# Patient Record
Sex: Female | Born: 1940 | Race: White | Hispanic: No | Marital: Married | Smoking: Never smoker
Health system: Southern US, Community
[De-identification: ages and names within clinical notes are randomized; demographics above are authoritative.]

## PROBLEM LIST (undated history)

## (undated) DIAGNOSIS — Z853 Personal history of malignant neoplasm of breast: Secondary | ICD-10-CM

## (undated) DIAGNOSIS — I10 Essential (primary) hypertension: Secondary | ICD-10-CM

## (undated) DIAGNOSIS — H9190 Unspecified hearing loss, unspecified ear: Secondary | ICD-10-CM

## (undated) DIAGNOSIS — F32A Depression, unspecified: Secondary | ICD-10-CM

## (undated) DIAGNOSIS — C44722 Squamous cell carcinoma of skin of right lower limb, including hip: Secondary | ICD-10-CM

## (undated) HISTORY — PX: BREAST LUMPECTOMY: SHX2

## (undated) HISTORY — PX: BREAST BIOPSY: SHX20

## (undated) HISTORY — PX: COLONOSCOPY: SHX174

## (undated) HISTORY — PX: CATARACT EXTRACTION: SUR2

## (undated) HISTORY — PX: COCHLEAR IMPLANT: SUR684

---

## 1999-11-22 DIAGNOSIS — Z510 Encounter for antineoplastic radiation therapy: Secondary | ICD-10-CM | POA: Insufficient documentation

## 2011-04-17 ENCOUNTER — Ambulatory Visit: Payer: Self-pay | Admitting: Internal Medicine

## 2011-05-23 DIAGNOSIS — Z800 Family history of malignant neoplasm of digestive organs: Secondary | ICD-10-CM | POA: Insufficient documentation

## 2011-05-23 DIAGNOSIS — D126 Benign neoplasm of colon, unspecified: Secondary | ICD-10-CM | POA: Insufficient documentation

## 2011-05-30 DIAGNOSIS — L570 Actinic keratosis: Secondary | ICD-10-CM | POA: Insufficient documentation

## 2012-02-07 ENCOUNTER — Ambulatory Visit: Payer: Self-pay | Admitting: Internal Medicine

## 2012-06-28 ENCOUNTER — Ambulatory Visit: Payer: Self-pay

## 2015-01-02 DIAGNOSIS — F32A Depression, unspecified: Secondary | ICD-10-CM | POA: Insufficient documentation

## 2015-01-24 ENCOUNTER — Ambulatory Visit
Admission: EM | Admit: 2015-01-24 | Discharge: 2015-01-24 | Disposition: A | Discharge status: Home / Self Care | Payer: Medicare Other | Attending: Internal Medicine | Admitting: Internal Medicine

## 2015-01-24 ENCOUNTER — Ambulatory Visit: Payer: Medicare Other

## 2015-01-24 DIAGNOSIS — S63501A Unspecified sprain of right wrist, initial encounter: Secondary | ICD-10-CM | POA: Diagnosis not present

## 2015-01-24 DIAGNOSIS — X58XXXA Exposure to other specified factors, initial encounter: Secondary | ICD-10-CM | POA: Diagnosis not present

## 2015-01-24 DIAGNOSIS — M25531 Pain in right wrist: Secondary | ICD-10-CM | POA: Diagnosis present

## 2015-01-24 NOTE — Discharge Instructions (Signed)
Ligament Sprain °Ligaments are tough, fibrous tissues that hold bones together at the joints. A sprain can occur when a ligament is stretched. This injury may take several weeks to heal. °HOME CARE INSTRUCTIONS  °· Rest the injured area for as long as directed by your caregiver. Then slowly start using the joint as directed by your caregiver and as the pain allows. °· Keep the affected joint raised if possible to lessen swelling. °· Apply ice for 15-20 minutes to the injured area every couple hours for the first half day, then 03-04 times per day for the first 48 hours. Put the ice in a plastic bag and place a towel between the bag of ice and your skin. °· Wear any splinting, casting, or elastic bandage applications as instructed. °· Only take over-the-counter or prescription medicines for pain, discomfort, or fever as directed by your caregiver. Do not use aspirin immediately after the injury unless instructed by your caregiver. Aspirin can cause increased bleeding and bruising of the tissues. °· If you were given crutches, continue to use them as instructed and do not resume weight bearing on the affected extremity until instructed. °SEEK MEDICAL CARE IF:  °· Your bruising, swelling, or pain increases. °· You have cold and numb fingers or toes if your arm or leg was injured. °SEEK IMMEDIATE MEDICAL CARE IF:  °· Your toes are numb or blue if your leg was injured. °· Your fingers are numb or blue if your arm was injured. °· Your pain is not responding to medicines and continues to stay the same or gets worse. °MAKE SURE YOU:  °· Understand these instructions. °· Will watch your condition. °· Will get help right away if you are not doing well or get worse. °Document Released: 08/01/2000 Document Revised: 10/27/2011 Document Reviewed: 05/30/2008 °ExitCare® Patient Information ©2015 [employer]. This information is not intended to replace advice given to you by your health care provider. Make sure you discuss any  questions you have with your health care provider. ° °

## 2015-01-24 NOTE — ED Provider Notes (Signed)
CSN: [id number]     Arrival date & time 01/24/15  1623 History   First MD Initiated Contact with Patient 01/24/15 1736     Chief Complaint  Patient presents with  . Wrist Pain   (Consider location/radiation/quality/duration/timing/severity/associated sxs/prior Treatment) HPI  This 74 year old female who presents with acute right wrist pain which indicates the volar surface of her distal forearm. She was weeding today having to pull hard on a rake when she felt a pop in her wrist and pain since then the pain is painful to flex her wrist and has some decreased range of motion but no swelling ecchymosis or erythema. denies any numbness or tingling.    History reviewed. No pertinent past medical history. History reviewed. No pertinent past surgical history. Family History  Problem Relation Age of Onset  . Alzheimer's disease Mother   . Heart attack Father    History  Substance Use Topics  . Smoking status: Never Smoker   . Smokeless tobacco: Not on file  . Alcohol Use: Yes     Comment: socially   OB History    No data available     Review of Systems  Musculoskeletal: Positive for myalgias.  All other systems reviewed and are negative.   Allergies  Review of patient's allergies indicates no known allergies.  Home Medications   Prior to Admission medications   Not on File   BP 169/90 mmHg  Pulse 82  Temp(Src) 97.9 F (36.6 C) (Oral)  Resp 18  Ht  (1.651 m)  Wt 120 lb (54.432 kg)  BMI 19.97 kg/m2  SpO2 98% Physical Exam  Constitutional: She appears well-developed and well-nourished.  HENT:  Head: Normocephalic and atraumatic.  Eyes: EOM are normal. Pupils are equal, round, and reactive to light.  Musculoskeletal:  examination of the right wrist shows no swelling erythema or ecchymosis.range of motion to flexion and extension is slightly limited by pain that she indicates over the volar surface of her distal forearm. Is no significant pain to deep palpation  however. She has a small ecchymosis over the first CM but no pain. Negative Finkelstein's test. she has good finger range of motion. Is good strength.    ED Course  Procedures (including critical care time) Labs Review Labs Reviewed - No data to display  Imaging Review Dg Wrist Complete Right  01/24/2015   CLINICAL DATA:  74 year old female with recent right-sided wrist pain while gardening complaining of a pop and a sharp pain on the radial side of the right wrist.  EXAM: RIGHT WRIST - COMPLETE 3+ VIEW  COMPARISON:  No priors.  FINDINGS: Old healed ulnar styloid avulsion fracture. No acute displaced fracture, subluxation or dislocation. Mild multifocal joint space narrowing, subchondral sclerosis and osteophyte formation throughout the wrist, indicative of mild osteoarthritis.  IMPRESSION: 1. No acute abnormality of the right wrist. 2. Mild osteoarthritis throughout the wrist. 3. Old healed ulnar styloid avulsion fracture incidentally noted.   Electronically Signed   By: Trudie Reed M.D.   On: 01/24/2015 17:56     MDM   1. Wrist sprain, right, initial encounter    Spoke with the patient and her husband regarding x-ray findings and my exam. I recommended that she discontinue gardening for about 1 week. To use her wrist splint for comfort. When she returns to gardening have recommended that she continue to use a wrist splint for those active times. She decided to take ibuprofen at home and I have recommended 600 mg 3 times  a day with food. He continues to have problems she will follow-up with her primary care  Plan: 1. Test/x-ray results and diagnosis reviewed with patient 2. rx as per orders; risks, benefits, potential side effects reviewed with patient 3. Recommend supportive treatment with  4. F/u prn if symptoms worsen or don't improve   Lutricia FeilWilliam P Tabius Rood, PA-C 01/24/15 1810

## 2015-01-24 NOTE — ED Notes (Signed)
States using a garden tool and right wrist began to hurt. + slight swelling.

## 2015-09-18 DIAGNOSIS — Z9621 Cochlear implant status: Secondary | ICD-10-CM | POA: Insufficient documentation

## 2015-09-18 DIAGNOSIS — H905 Unspecified sensorineural hearing loss: Secondary | ICD-10-CM | POA: Insufficient documentation

## 2015-09-18 DIAGNOSIS — C50919 Malignant neoplasm of unspecified site of unspecified female breast: Secondary | ICD-10-CM | POA: Insufficient documentation

## 2016-01-11 DIAGNOSIS — J309 Allergic rhinitis, unspecified: Secondary | ICD-10-CM | POA: Insufficient documentation

## 2016-01-11 DIAGNOSIS — B9689 Other specified bacterial agents as the cause of diseases classified elsewhere: Secondary | ICD-10-CM | POA: Insufficient documentation

## 2016-02-13 ENCOUNTER — Ambulatory Visit (INDEPENDENT_AMBULATORY_CARE_PROVIDER_SITE_OTHER): Payer: Medicare Other

## 2016-02-13 ENCOUNTER — Ambulatory Visit
Admission: EM | Admit: 2016-02-13 | Discharge: 2016-02-13 | Disposition: A | Discharge status: Home / Self Care | Payer: Medicare Other | Attending: Family Medicine | Admitting: Family Medicine

## 2016-02-13 DIAGNOSIS — S61012A Laceration without foreign body of left thumb without damage to nail, initial encounter: Secondary | ICD-10-CM | POA: Diagnosis not present

## 2016-02-13 HISTORY — DX: Personal history of malignant neoplasm of breast: Z85.3

## 2016-02-13 MED ORDER — MUPIROCIN 2 % EX OINT: TOPICAL_OINTMENT | CUTANEOUS | Status: DC

## 2016-02-13 MED ORDER — TETANUS-DIPHTH-ACELL PERTUSSIS 5-2.5-18.5 LF-MCG/0.5 IM SUSP
0.5000 mL | Freq: Once | INTRAMUSCULAR | Status: AC
  Administered 2016-02-13: 0.5 mL via INTRAMUSCULAR

## 2016-02-13 MED ORDER — LIDOCAINE HCL (PF) 1 % IJ SOLN: 5.0000 mL | Freq: Once | INTRAMUSCULAR | Status: DC

## 2016-02-13 MED ORDER — BUPIVACAINE HCL (PF) 0.25 % IJ SOLN: 5.0000 mL | Freq: Once | INTRAMUSCULAR | Status: DC

## 2016-02-13 MED ORDER — CEPHALEXIN 500 MG PO CAPS: 500.0000 mg | ORAL_CAPSULE | Freq: Three times a day (TID) | ORAL | Status: DC

## 2016-02-13 NOTE — ED Notes (Signed)
Patient complains of laceration to her left hand thumb. Patient states that incident happened around 1pm today while fixing lunch. Patient states that she cut her hand on the can opener.

## 2016-02-13 NOTE — ED Provider Notes (Signed)
[facility]  ____________________________________________  Time seen: Approximately 2:55 PM  I have reviewed the triage vital signs and the nursing notes.   HISTORY  Chief Complaint Extremity Laceration  HPI Connie Ellison is a 75 y.o. female presents for laceration to left thumb. Reports this happened just prior to arrival when she was opening a can of corn. Denies fall or other trauma, denies crush injury. Denies other injury. States mild pain at laceration site. Denies numbness or tingling sensation. Denies decreased range of motion.     Past Medical History  Diagnosis Date  . History of breast cancer     There are no active problems to display for this patient.   Past Surgical History  Procedure Laterality Date  . Cochlear implant      Current Outpatient Rx  Name  Route  Sig  Dispense  Refill  . venlafaxine (EFFEXOR) 75 MG tablet   Oral   Take 75 mg by mouth 2 (two) times daily.           Allergies Review of patient's allergies indicates no known allergies.  Family History  Problem Relation Age of Onset  . Alzheimer's disease Mother   . Heart attack Father     Social History Social History  Substance Use Topics  . Smoking status: Never Smoker   . Smokeless tobacco: None  . Alcohol Use: 0.0 oz/week    0 Standard drinks or equivalent per week     Comment: socially    Review of Systems Constitutional: No fever/chills Eyes: No visual changes. ENT: No sore throat. Cardiovascular: Denies chest pain. Respiratory: Denies shortness of breath. Gastrointestinal: No abdominal pain.  No nausea, no vomiting.  No diarrhea.  No constipation. Genitourinary: Negative for dysuria. Musculoskeletal: Negative for back pain. Skin: Negative for rash. As above.  Neurological: Negative for headaches, focal weakness or numbness.  10-point ROS otherwise negative.  ____________________________________________   PHYSICAL EXAM:  VITAL SIGNS: ED Triage Vitals   Enc Vitals Group     BP 02/13/16 1415 162/98 mmHg     Pulse Rate 02/13/16 1415 87     Resp 02/13/16 1415 16     Temp 02/13/16 1415 98.1 F (36.7 C)     Temp Source 02/13/16 1415 Tympanic     SpO2 02/13/16 1415 99 %     Weight 02/13/16 1415 120 lb (54.432 kg)     Height 02/13/16 1415 5\' 5"  (1.651 m)     Head Cir --      Peak Flow --      Pain Score 02/13/16 1418 5     Pain Loc --      Pain Edu? --      Excl. in GC? --    Constitutional: Alert and oriented. Well appearing and in no acute distress. Eyes: Conjunctivae are normal. PERRL. EOMI. Head: Atraumatic.  Ears: normal external appearance.   Nose: No congestion/rhinnorhea.  Mouth/Throat: Mucous membranes are moist.   Neck: No stridor.  No cervical spine tenderness to palpation. Hematological/Lymphatic/Immunilogical: No cervical lymphadenopathy. Cardiovascular: Normal rate, regular rhythm. Grossly normal heart sounds.  Good peripheral circulation. Respiratory: Normal respiratory effort.  No retractions. Lungs CTAB. No wheezes, rales or rhonchi.  Gastrointestinal: Soft and nontender. No distention.  Musculoskeletal: No lower or upper extremity tenderness nor edema.  Neurologic:  Normal speech and language. No gross focal neurologic deficits are appreciated. No gait instability. Skin:  Skin is warm, dry and intact. No rash noted. Except: left medial distal thumb  with 5 cm flap laceration, mild active bleeding, no foreign body visualized, no tendon or bone visualized, full range of motion, sensation intact to left thumb, no motor or tendon deficit, minimal tenderness, left arm otherwise nontender.  Psychiatric: Mood and affect are normal. Speech and behavior are normal.  ____________________________________________   LABS (all labs ordered are listed, but only abnormal results are displayed)  Labs Reviewed - No data to display ____________________________________________  RADIOLOGY  EXAM: LEFT THUMB 2+V  COMPARISON:  02/07/2012  FINDINGS: Soft tissue laceration noted near the tip of the left thumb. No radiopaque foreign body. No fracture, subluxation or dislocation.  IMPRESSION: No acute bony abnormality or radiopaque foreign body.   Electronically Signed By: Charlett NoseKevin Dover M.D. On: 02/13/2016 15:17  I, Renford DillsLindsey Karema Tocci, personally viewed and evaluated these images (plain radiographs) as part of my medical decision making, as well as reviewing the written report by the radiologist.  ____________________________________________   PROCEDURES  Procedure(s) performed:  Procedure(s) performed:  Procedure explained and verbal consent obtained. Consent: Verbal consent obtained. Written consent not obtained. Risks and benefits: risks, benefits and alternatives were discussed Patient identity confirmed: verbally with patient and hospital-assigned identification number  Consent given by: patient   Laceration Repair Location: left thumb Length: 5 cm Foreign bodies: no foreign bodies Tendon involvement: none Nerve involvement: none Preparation: Patient was prepped and draped in the usual sterile fashion. Anesthesia with left thumb digital block with 0.25% bupivacaine.  Irrigation solution: saline and betadine Irrigation method: jet lavage  Amount of cleaning: copious Repaired with 5-0 nylon Number of sutures: 10 Technique: simple interrupted  Approximation: loose Patient tolerate well. Wound well approximated post repair.  Antibiotic ointment and dressing applied.  Wound care instructions provided.  Observe for any signs of infection or other problems.     ____________________________________________   INITIAL IMPRESSION / ASSESSMENT AND PLAN / ED COURSE  Pertinent labs & imaging results that were available during my care of the patient were reviewed by me and considered in my medical decision making (see chart for details).  Well appearing patient, presented for laceration to left  thumb post accidental cutting self with can opener at home.Left thumb xray negative for acute bony abnormality or radiopaque foreign body per radiologist. Laceration repaired with x 10 sutures. Patient tolerated well. Discussed wound care at home. Tetanus immunization updated. Dressing applied. Wound check in 2-3 days. Suture removal in 7-10 days. Will start on oral cephalexin and topical bactroban. Discussed indication, risks and benefits of medications with patient.  Discussed follow up with Primary care physician this week. Discussed follow up and return parameters including no resolution or any worsening concerns. Patient verbalized understanding and agreed to plan.   ____________________________________________   FINAL CLINICAL IMPRESSION(S) / ED DIAGNOSES  Final diagnoses:  Thumb laceration, left, initial encounter     Discharge Medication List as of 02/13/2016  3:41 PM    START taking these medications   Details  cephALEXin (KEFLEX) 500 MG capsule Take 1 capsule (500 mg total) by mouth 3 (three) times daily., Starting 02/13/2016, Until Discontinued, Normal    mupirocin ointment (BACTROBAN) 2 % Apply three times a day for 5 days., Normal        Note: This dictation was prepared with Dragon dictation along with smaller phrase technology. Any transcriptional errors that result from this process are unintentional.       Renford DillsLindsey Thania Woodlief, NP 02/19/16 [phone]

## 2016-02-13 NOTE — Discharge Instructions (Signed)
Take medication as prescribed. Keep clean and dry as discussed.   Return to Urgent care in 7-10 days for suture removal.  Follow up with your primary care physician this week as needed. Return to Urgent care sooner for redness, swelling, drainage, new or worsening concerns.    Laceration Care, Adult A laceration is a cut that goes through all layers of the skin. The cut also goes into the tissue that is right under the skin. Some cuts heal on their own. Others need to be closed with stitches (sutures), staples, skin adhesive strips, or wound glue. Taking care of your cut lowers your risk of infection and helps your cut to heal better. HOW TO TAKE CARE OF YOUR CUT For stitches or staples:  Keep the wound clean and dry.  If you were given a bandage (dressing), you should change it at least one time per day or as told by your doctor. You should also change it if it gets wet or dirty.  Keep the wound completely dry for the first 24 hours or as told by your doctor. After that time, you may take a shower or a bath. However, make sure that the wound is not soaked in water until after the stitches or staples have been removed.  Clean the wound one time each day or as told by your doctor:  Wash the wound with soap and water.  Rinse the wound with water until all of the soap comes off.  Pat the wound dry with a clean towel. Do not rub the wound.  After you clean the wound, put a thin layer of antibiotic ointment on it as told by your doctor. This ointment:  Helps to prevent infection.  Keeps the bandage from sticking to the wound.  Have your stitches or staples removed as told by your doctor. If your doctor used skin adhesive strips:   Keep the wound clean and dry.  If you were given a bandage, you should change it at least one time per day or as told by your doctor. You should also change it if it gets dirty or wet.  Do not get the skin adhesive strips wet. You can take a shower or a  bath, but be careful to keep the wound dry.  If the wound gets wet, pat it dry with a clean towel. Do not rub the wound.  Skin adhesive strips fall off on their own. You can trim the strips as the wound heals. Do not remove any strips that are still stuck to the wound. They will fall off after a while. If your doctor used wound glue:  Try to keep your wound dry, but you may briefly wet it in the shower or bath. Do not soak the wound in water, such as by swimming.  After you take a shower or a bath, gently pat the wound dry with a clean towel. Do not rub the wound.  Do not do any activities that will make you really sweaty until the skin glue has fallen off on its own.  Do not apply liquid, cream, or ointment medicine to your wound while the skin glue is still on.  If you were given a bandage, you should change it at least one time per day or as told by your doctor. You should also change it if it gets dirty or wet.  If a bandage is placed over the wound, do not let the tape for the bandage touch the skin glue.  Do not pick at the glue. The skin glue usually stays on for 5-10 days. Then, it falls off of the skin. General Instructions  To help prevent scarring, make sure to cover your wound with sunscreen whenever you are outside after stitches are removed, after adhesive strips are removed, or when wound glue stays in place and the wound is healed. Make sure to wear a sunscreen of at least 30 SPF.  Take over-the-counter and prescription medicines only as told by your doctor.  If you were given antibiotic medicine or ointment, take or apply it as told by your doctor. Do not stop using the antibiotic even if your wound is getting better.  Do not scratch or pick at the wound.  Keep all follow-up visits as told by your doctor. This is important.  Check your wound every day for signs of infection. Watch for:  Redness, swelling, or pain.  Fluid, blood, or pus.  Raise (elevate) the  injured area above the level of your heart while you are sitting or lying down, if possible. GET HELP IF:  You got a tetanus shot and you have any of these problems at the injection site:  Swelling.  Very bad pain.  Redness.  Bleeding.  You have a fever.  A wound that was closed breaks open.  You notice a bad smell coming from your wound or your bandage.  You notice something coming out of the wound, such as wood or glass.  Medicine does not help your pain.  You have more redness, swelling, or pain at the site of your wound.  You have fluid, blood, or pus coming from your wound.  You notice a change in the color of your skin near your wound.  You need to change the bandage often because fluid, blood, or pus is coming from the wound.  You start to have a new rash.  You start to have numbness around the wound. GET HELP RIGHT AWAY IF:  You have very bad swelling around the wound.  Your pain suddenly gets worse and is very bad.  You notice painful lumps near the wound or on skin that is anywhere on your body.  You have a red streak going away from your wound.  The wound is on your hand or foot and you cannot move a finger or toe like you usually can.  The wound is on your hand or foot and you notice that your fingers or toes look pale or bluish.   This information is not intended to replace advice given to you by your health care provider. Make sure you discuss any questions you have with your health care provider.   Document Released: 01/21/2008 Document Revised: 12/19/2014 Document Reviewed: 07/31/2014 Elsevier Interactive Patient Education [employer]2016 [employer]

## 2016-02-26 ENCOUNTER — Encounter: Payer: Self-pay | Admitting: *Deleted

## 2016-02-26 ENCOUNTER — Ambulatory Visit
Admission: EM | Admit: 2016-02-26 | Discharge: 2016-02-26 | Disposition: A | Discharge status: Home / Self Care | Payer: Medicare Other

## 2016-02-26 DIAGNOSIS — Z4802 Encounter for removal of sutures: Secondary | ICD-10-CM | POA: Diagnosis not present

## 2016-02-26 NOTE — ED Notes (Signed)
Patient is here for suture removal. Laceration is clean with no redness or drainage present. 11 sutures removed.

## 2016-05-05 DIAGNOSIS — I10 Essential (primary) hypertension: Secondary | ICD-10-CM | POA: Insufficient documentation

## 2016-06-17 DIAGNOSIS — Z6791 Unspecified blood type, Rh negative: Secondary | ICD-10-CM | POA: Insufficient documentation

## 2016-07-04 ENCOUNTER — Ambulatory Visit
Admission: EM | Admit: 2016-07-04 | Discharge: 2016-07-04 | Disposition: A | Discharge status: Home / Self Care | Payer: Medicare Other | Attending: Family Medicine | Admitting: Family Medicine

## 2016-07-04 DIAGNOSIS — S61213A Laceration without foreign body of left middle finger without damage to nail, initial encounter: Secondary | ICD-10-CM | POA: Diagnosis not present

## 2016-07-04 NOTE — ED Triage Notes (Signed)
Patient has a laceration to her left middle finger. Injury occurred last night around 5pm. Patient states that she cut her finger while trying to chop carrots.

## 2016-07-04 NOTE — ED Provider Notes (Signed)
CSN: [id number]     Arrival date & time 07/04/16  [id number] History   None    Chief Complaint  Patient presents with  . Laceration   (Consider location/radiation/quality/duration/timing/severity/associated sxs/prior Treatment) HPI  This 75 year old female who presents with a laceration to her nondominant left middle finger. His occurred last night at 5 PM while she was preparing vegetables. Do not come to the clinic at that time because she does not like driving at night. States that it bled all night long. In the clinic this morning she had any bleeding. Is current on her tetanus toxoid having cut herself about a month and a half ago on a can lid.       Past Medical History:  Diagnosis Date  . History of breast cancer    Past Surgical History:  Procedure Laterality Date  . COCHLEAR IMPLANT     Family History  Problem Relation Age of Onset  . Alzheimer's disease Mother   . Heart attack Father    Social History  Substance Use Topics  . Smoking status: Never Smoker  . Smokeless tobacco: Never Used  . Alcohol use 0.0 oz/week     Comment: socially   OB History    No data available     Review of Systems  Constitutional: Positive for activity change. Negative for chills, fatigue and fever.  Skin: Positive for wound.  All other systems reviewed and are negative.   Allergies  Patient has no known allergies.  Home Medications   Prior to Admission medications   Medication Sig Start Date End Date Taking? Authorizing Provider  venlafaxine (EFFEXOR) 75 MG tablet Take 75 mg by mouth 2 (two) times daily.   Yes Historical Provider, MD  mupirocin ointment (BACTROBAN) 2 % Apply three times a day for 5 days. 02/13/16   Renford DillsLindsey Miller, NP   Meds Ordered and Administered this Visit  Medications - No data to display  BP (!) 158/87 (BP Location: Left Arm)   Pulse 84   Temp 98.9 F (37.2 C) (Oral)   Resp 17   Ht 5\' 5"  (1.651 m)   Wt 120 lb (54.4 kg)   SpO2 100%   BMI 19.97 kg/m   No data found.   Physical Exam  Constitutional: She is oriented to person, place, and time. She appears well-developed and well-nourished. No distress.  HENT:  Head: Normocephalic and atraumatic.  Eyes: EOM are normal. Pupils are equal, round, and reactive to light. Right eye exhibits no discharge. Left eye exhibits no discharge.  Neck: Normal range of motion. Neck supple.  Musculoskeletal: Normal range of motion. She exhibits no edema or tenderness.  Lymphadenopathy:    She has no cervical adenopathy.  Neurological: She is alert and oriented to person, place, and time.  Skin: Skin is warm and dry. She is not diaphoretic.  Examination of the left nondominant middle finger tip shows a 7 millimeter laceration over the distal tip of finger. Is extend into the subcutaneous tissue. The vascular function is intact distally as is tendon pull-through.  Psychiatric: She has a normal mood and affect. Her behavior is normal. Judgment and thought content normal.  Nursing note and vitals reviewed.   Urgent Care Course   Clinical Course     Procedures (including critical care time)  Labs Review Labs Reviewed - No data to display  Imaging Review No results found.   Visual Acuity Review  Right Eye Distance:   Left Eye Distance:   Bilateral Distance:  Right Eye Near:   Left Eye Near:    Bilateral Near:         MDM   1. Laceration of left middle finger without foreign body without damage to nail, initial encounter    Told patient that does she is past the time of that is acceptable for closure of the wound. Therefore we will allow the wound to heal by secondary intention. Reviewed signs and symptoms of infection with the patient. She'll return to clinic if these occur. I will have her wash the wound 3 times daily apply Bactroban and a dry sterile dressing.    Lutricia FeilWilliam P Geraldine Sandberg, PA-C 07/04/16 1047

## 2016-07-07 ENCOUNTER — Telehealth: Payer: Self-pay

## 2016-07-07 NOTE — Telephone Encounter (Signed)
Courtesy call back completed today for patients recent visit at [facility]. Patient did not answer, left message on voicemail to call back with any questions or concerns.   

## 2017-03-31 DIAGNOSIS — H2510 Age-related nuclear cataract, unspecified eye: Secondary | ICD-10-CM | POA: Insufficient documentation

## 2017-09-21 DIAGNOSIS — C44722 Squamous cell carcinoma of skin of right lower limb, including hip: Secondary | ICD-10-CM | POA: Insufficient documentation

## 2018-03-15 DIAGNOSIS — Z85828 Personal history of other malignant neoplasm of skin: Secondary | ICD-10-CM | POA: Insufficient documentation

## 2018-07-19 DIAGNOSIS — E782 Mixed hyperlipidemia: Secondary | ICD-10-CM | POA: Insufficient documentation

## 2019-04-15 DIAGNOSIS — J310 Chronic rhinitis: Secondary | ICD-10-CM | POA: Insufficient documentation

## 2019-04-15 DIAGNOSIS — R0989 Other specified symptoms and signs involving the circulatory and respiratory systems: Secondary | ICD-10-CM | POA: Insufficient documentation

## 2019-04-15 DIAGNOSIS — J320 Chronic maxillary sinusitis: Secondary | ICD-10-CM | POA: Insufficient documentation

## 2021-06-06 ENCOUNTER — Ambulatory Visit (INDEPENDENT_AMBULATORY_CARE_PROVIDER_SITE_OTHER): Payer: Medicare HMO

## 2021-06-06 ENCOUNTER — Encounter: Payer: Self-pay | Admitting: Licensed Clinical Social Worker

## 2021-06-06 ENCOUNTER — Other Ambulatory Visit: Payer: Self-pay

## 2021-06-06 ENCOUNTER — Ambulatory Visit
Admission: EM | Admit: 2021-06-06 | Discharge: 2021-06-06 | Disposition: A | Discharge status: Home / Self Care | Payer: Medicare HMO

## 2021-06-06 DIAGNOSIS — S2242XA Multiple fractures of ribs, left side, initial encounter for closed fracture: Secondary | ICD-10-CM | POA: Diagnosis not present

## 2021-06-06 DIAGNOSIS — I499 Cardiac arrhythmia, unspecified: Secondary | ICD-10-CM

## 2021-06-06 DIAGNOSIS — W19XXXA Unspecified fall, initial encounter: Secondary | ICD-10-CM | POA: Diagnosis not present

## 2021-06-06 DIAGNOSIS — S2249XA Multiple fractures of ribs, unspecified side, initial encounter for closed fracture: Secondary | ICD-10-CM | POA: Diagnosis not present

## 2021-06-06 DIAGNOSIS — I491 Atrial premature depolarization: Secondary | ICD-10-CM | POA: Diagnosis not present

## 2021-06-06 MED ORDER — HYDROCODONE-ACETAMINOPHEN 5-325 MG PO TABS: 1.0000 | ORAL_TABLET | Freq: Three times a day (TID) | ORAL | 0 refills | Status: AC | PRN

## 2021-06-06 NOTE — ED Provider Notes (Addendum)
[facility]    CSN: [id number] Arrival date & time: 06/06/21  0840      History   Chief Complaint Chief Complaint  Patient presents with   Fall   Rib Injury    HPI Connie Ellison is a 80 y.o. female presenting for left-sided rib pain since yesterday.  Patient says she had a glass of water by her bed and it spilled.  She says she slipped on the water on her wood floor and fell onto her left side.  She has significant pain of the left ribs.  Increased pain when she breathes deeply so she says she has been abdominal breathing.  She has been taking ibuprofen and Tylenol for pain but says it has not helped.  She denies any head injury or loss of consciousness.  Denies any other injury sustained in the fall.  Patient does not report any inciting events such as headache, dizziness, presyncope, chest pain, palpitations, shortness of breath, etc.  Patient denies any breathing difficulty or chest pain at this time.  No other complaints.  HPI  Past Medical History:  Diagnosis Date   History of breast cancer     There are no problems to display for this patient.   Past Surgical History:  Procedure Laterality Date   COCHLEAR IMPLANT      OB History   No obstetric history on file.      Home Medications    Prior to Admission medications   Medication Sig Start Date End Date Taking? Authorizing Provider  HYDROcodone-acetaminophen (NORCO/VICODIN) 5-325 MG tablet Take 1 tablet by mouth every 8 (eight) hours as needed for up to 5 days. 06/06/21 06/11/21 Yes Shirlee Latch, PA-C  venlafaxine (EFFEXOR) 75 MG tablet Take 75 mg by mouth 2 (two) times daily.   Yes [provider]  atorvastatin (LIPITOR) 10 MG tablet Take 10 mg by mouth daily. 03/28/21   [provider]  lisinopril (ZESTRIL) 5 MG tablet Take 5 mg by mouth daily. 04/05/21   [provider]  mupirocin ointment (BACTROBAN) 2 % Apply three times a day for 5 days. 02/13/16   Renford Dills, NP     Family History Family History  Problem Relation Age of Onset   Alzheimer's disease Mother    Heart attack Father     Social History Social History   Tobacco Use   Smoking status: Never   Smokeless tobacco: Never  Substance Use Topics   Alcohol use: Yes    Alcohol/week: 0.0 standard drinks    Comment: socially   Drug use: No     Allergies   Patient has no known allergies.   Review of Systems Review of Systems  Constitutional:  Negative for fatigue.  Respiratory:  Negative for cough, shortness of breath and wheezing.   Cardiovascular:  Negative for chest pain and palpitations.  Gastrointestinal:  Negative for abdominal pain.  Musculoskeletal:  Positive for arthralgias (Left ribs) and joint swelling (Left ribs). Negative for gait problem.  Skin:  Negative for color change and wound.  Neurological:  Negative for dizziness, syncope, weakness, numbness and headaches.    Physical Exam Triage Vital Signs ED Triage Vitals  Enc Vitals Group     BP 06/06/21 0921 (!) 151/74     Pulse Rate 06/06/21 0921 84     Resp 06/06/21 0921 16     Temp 06/06/21 0921 98.6 F (37 C)     Temp Source 06/06/21 0921 Oral  SpO2 06/06/21 0921 96 %     Weight 06/06/21 0918 130 lb (59 kg)     Height 06/06/21 0918 5\' 6"  (1.676 m)     Head Circumference --      Peak Flow --      Pain Score 06/06/21 0918 7     Pain Loc --      Pain Edu? --      Excl. in GC? --    No data found.  Updated Vital Signs BP (!) 151/74 (BP Location: Right Arm)   Pulse 84   Temp 98.6 F (37 C) (Oral)   Resp 16   Ht 5\' 6"  (1.676 m)   Wt 130 lb (59 kg)   SpO2 96%   BMI 20.98 kg/m      Physical Exam Vitals and nursing note reviewed.  Constitutional:      General: She is not in acute distress.    Appearance: Normal appearance. She is not ill-appearing or toxic-appearing.  HENT:     Head: Normocephalic and atraumatic.  Eyes:     General: No scleral icterus.       Right eye: No discharge.         Left eye: No discharge.     Extraocular Movements: Extraocular movements intact.     Conjunctiva/sclera: Conjunctivae normal.     Pupils: Pupils are equal, round, and reactive to light.  Cardiovascular:     Rate and Rhythm: Normal rate. Rhythm irregular.     Heart sounds: Normal heart sounds.  Pulmonary:     Effort: Pulmonary effort is normal. No respiratory distress.     Breath sounds: Normal breath sounds.     Comments: Swelling of anterior lateral left ribs 5-10 with tenderness diffusely Chest:     Chest wall: Tenderness present.  Musculoskeletal:     Cervical back: Neck supple.  Skin:    General: Skin is dry.  Neurological:     General: No focal deficit present.     Mental Status: She is alert and oriented to person, place, and time. Mental status is at baseline.     Motor: No weakness.     Coordination: Coordination normal.     Gait: Gait normal.  Psychiatric:        Mood and Affect: Mood normal.        Behavior: Behavior normal.        Thought Content: Thought content normal.     UC Treatments / Results  Labs (all labs ordered are listed, but only abnormal results are displayed) Labs Reviewed - No data to display  EKG   Radiology DG Ribs Unilateral W/Chest Left  Result Date: 06/06/2021 CLINICAL DATA:  80 year old female fell yesterday. Left rib pain and painful breathing. EXAM: LEFT RIBS AND CHEST - 3+ VIEW COMPARISON:  None. FINDINGS: Large lung volumes. Normal cardiac size and mediastinal contours. Visualized tracheal air column is within normal limits. No pneumothorax or pleural effusion. Both lungs appear clear. There are multiple small right chest surgical clips. Negative visible bowel gas pattern. Mild thoracolumbar scoliosis. Four oblique views of the left ribs. There is a mildly displaced fracture of the left anterolateral 6th rib. Minimally displaced fracture of the adjacent 5th rib. And mildly displaced fractures also of the adjacent left 7th through 10th  ribs. Anterior rib marker in that region. No pneumothorax or pleural effusion identified on the oblique views. Other visible osseous structures appear grossly intact. IMPRESSION: 1. Mildly displaced fractures of the  left ribs 5 through 10. 2. No acute cardiopulmonary abnormality. Electronically Signed   By: Odessa Fleming M.D.   On: 06/06/2021 10:20    Procedures ED EKG  Date/Time: 06/06/2021 10:20 AM Performed by: Shirlee Latch, PA-C Authorized by: Shirlee Latch, PA-C   ECG reviewed by ED Physician in the absence of a cardiologist: yes   Previous ECG:    Previous ECG:  Unavailable Interpretation:    Interpretation: abnormal   Rate:    ECG rate:  64 Rhythm:    Rhythm: sinus rhythm   Ectopy:    Ectopy: PAC   QRS:    QRS axis:  Left   QRS intervals:  Normal   QRS conduction: normal   ST segments:    ST segments:  Normal T waves:    T waves: non-specific and inverted     Inverted:  V4, V5 and V6 Comments:     NSR with PACs, inverted T waves V4, V5, V6 (including critical care time)  Medications Ordered in UC Medications - No data to display  Initial Impression / Assessment and Plan / UC Course  I have reviewed the triage vital signs and the nursing notes.  Pertinent labs & imaging results that were available during my care of the patient were reviewed by me and considered in my medical decision making (see chart for details).  80 year old female presenting for pain of the left ribs since yesterday.  Admits to accidental slip and fall onto the left side.  On exam she does have swelling and tenderness of anterior lateral ribs 6 through 10.  Chest is clear to auscultation but possibly diminished at the left lung base.  Irregular heart rhythm.  I discussed the irregular heart rhythm with patient and she says she is always had that.  She denies any history of atrial fibrillation and says it runs in her family and her father had the same thing but she is unsure what it is called.  Patient  denies any chest pain or breathing difficulty but has increased rib pain when she breathes.  No history of head injury or syncope.  No other injuries.  X-ray of left ribs and chest obtained today and independently reviewed by me.  Patient appears to have 5-6 rib fractures.    EKG obtained today due to irregular heart rhythm.  EKG shows normal sinus rhythm with PACs and inverted T waves in V4, V5 and V6.  No old EKGs to compare to.  X-ray today shows fractures of ribs 5 through 10 that are mildly displaced.  No evidence of pneumothorax or midline shift.  I discussed x-ray results with patient.  Discussed case with Dr. Leonides Grills.  Patient is stable and x-ray does not show any evidence of pneumothorax or any findings other than the multiple mildly displaced rib fractures.  Patient should be safe to be treated outpatient at home.  Discussed adequate pain control.  Patient prescribed Norco for severe pain.  Also advised to apply ice to the area and hold a pillow against her side to make sure she is taking deep breaths to try to prevent any atelectasis and subsequent pneumonia.  Thoroughly reviewed ED precautions with patient and advised her to go to emergency department or call 911 for any breathing difficulty or chest pain.  We also discussed the fact that she has PACs on her EKG.  She believes that is nothing new.  I cannot find any old EKGs to compare to so I am  unsure.  Advised her to make an appointment with her PCP next week to discuss the rib fractures and abnormal EKG.  Advised to go to ED sooner for any chest pain, breathing difficulty or palpitations, dizziness or weakness.  Patient agrees to plan.  Final Clinical Impressions(s) / UC Diagnoses   Final diagnoses:  Multiple rib fractures involving four or more ribs  Fall, initial encounter  Premature atrial contractions  Irregular heart rhythm     Discharge Instructions      -You have 6 rib fractures that are mildly displaced. This puts you  at high risk for lung collapse, lung bruising, organ injury, breathing issues. So be very careful. Do not lay on this side. Hold a pillow against your side and take deep breaths. As we discussed you can get pneumonia if you are not breathing  well enough.  -I have sent hydrocodone for pain. Apply ice to the area and it is ok to take low dose Advil or Aleve if you do not want the pain meds. -You EKG was abnormal, The top part of your heart is beating irregularly. I do not know if this has anything to do with the fall or not. I cannot find any old EKGs to compare to.  -If you experience cough, congestion, fever or breathing difficulty, please go to emergency department for evaluation of possible pneumonia.  If you have any chest pain or sudden onset of breathing difficulty, please call 911. -For follow-up appointment to see your PCP next week.     ED Prescriptions     Medication Sig Dispense Auth. Provider   HYDROcodone-acetaminophen (NORCO/VICODIN) 5-325 MG tablet Take 1 tablet by mouth every 8 (eight) hours as needed for up to 5 days. 15 tablet Shirlee Latch, PA-C      I have reviewed the PDMP during this encounter.   Shirlee Latch, PA-C 06/06/21 1048    Eusebio Friendly B, PA-C 06/06/21 1048

## 2021-06-06 NOTE — Discharge Instructions (Addendum)
-  You have 6 rib fractures that are mildly displaced. This puts you at high risk for lung collapse, lung bruising, organ injury, breathing issues. So be very careful. Do not lay on this side. Hold a pillow against your side and take deep breaths. As we discussed you can get pneumonia if you are not breathing  well enough.  -I have sent hydrocodone for pain. Apply ice to the area and it is ok to take low dose Advil or Aleve if you do not want the pain meds. -You EKG was abnormal, The top part of your heart is beating irregularly. I do not know if this has anything to do with the fall or not. I cannot find any old EKGs to compare to.  -If you experience cough, congestion, fever or breathing difficulty, please go to emergency department for evaluation of possible pneumonia.  If you have any chest pain or sudden onset of breathing difficulty, please call 911. -For follow-up appointment to see your PCP next week.

## 2021-06-06 NOTE — ED Triage Notes (Signed)
Pt had a  fall yesterday and now has rib pain on left side.

## 2022-12-17 ENCOUNTER — Ambulatory Visit
Admission: EM | Admit: 2022-12-17 | Discharge: 2022-12-17 | Disposition: A | Discharge status: Home / Self Care | Payer: Medicare HMO

## 2022-12-17 ENCOUNTER — Ambulatory Visit (INDEPENDENT_AMBULATORY_CARE_PROVIDER_SITE_OTHER): Payer: Medicare HMO

## 2022-12-17 DIAGNOSIS — S8992XA Unspecified injury of left lower leg, initial encounter: Secondary | ICD-10-CM

## 2022-12-17 DIAGNOSIS — S81802A Unspecified open wound, left lower leg, initial encounter: Secondary | ICD-10-CM

## 2022-12-17 HISTORY — DX: Squamous cell carcinoma of skin of right lower limb, including hip: C44.722

## 2022-12-17 HISTORY — DX: Depression, unspecified: F32.A

## 2022-12-17 HISTORY — DX: Essential (primary) hypertension: I10

## 2022-12-17 HISTORY — DX: Unspecified hearing loss, unspecified ear: H91.90

## 2022-12-17 MED ORDER — MUPIROCIN 2 % EX OINT: 1.0000 | TOPICAL_OINTMENT | Freq: Two times a day (BID) | CUTANEOUS | 0 refills | Status: AC

## 2022-12-17 MED ORDER — CEPHALEXIN 500 MG PO CAPS: 500.0000 mg | ORAL_CAPSULE | Freq: Four times a day (QID) | ORAL | 0 refills | Status: AC

## 2022-12-17 NOTE — ED Provider Notes (Signed)
[facility]    CSN: [id number] Arrival date & time: 12/17/22  1114      History   Chief Complaint Chief Complaint  Patient presents with   Leg Injury    Lower LT leg    Wound Check    HPI Connie Ellison is a 82 y.o. female presenting for 7 to 10-day history of a wound of the left lower leg.  Patient says that she injured it while she was doing some yard work.  She says she slipped and hit it on something but she is not exactly sure what.  She is unsure if it is changed in any way since the initial injury.  States that she has been really busy with a wedding and other activities and has not really paid much attention.  Has been keeping the area clean.  Has not noticed it being around him and has not noticed any drainage.  Denies any history of peripheral vascular disease or diabetes.  HPI  Past Medical History:  Diagnosis Date   Deafness    Depression    History of breast cancer    RT breast   Hypertension    SCC (squamous cell carcinoma), leg, right     Patient Active Problem List   Diagnosis Date Noted   Chronic rhinitis 04/15/2019   Chronic maxillary sinusitis 04/15/2019   Sinus complaint 04/15/2019   Mixed hyperlipidemia 07/19/2018   History of nonmelanoma skin cancer 03/15/2018   SCC (squamous cell carcinoma), leg, right 09/21/2017   Senile nuclear sclerosis 03/31/2017   Blood type, Rh negative 06/17/2016   Essential hypertension 05/05/2016   Allergic rhinitis 01/11/2016   Acute bacterial sinusitis 01/11/2016   Cochlear implant in place 09/18/2015   Hearing loss, sensorineural 09/18/2015   Malignant neoplasm of breast (female) (HCC) 09/18/2015   Depression 01/02/2015   Actinic keratoses 05/30/2011   Benign neoplasm of colon 05/23/2011   Family history of malignant neoplasm of gastrointestinal tract 05/23/2011   Encounter for radiotherapy 11/22/1999    Past Surgical History:  Procedure Laterality Date   BREAST BIOPSY     BREAST LUMPECTOMY      CATARACT EXTRACTION     COCHLEAR IMPLANT     COLONOSCOPY      OB History   No obstetric history on file.      Home Medications    Prior to Admission medications   Medication Sig Start Date End Date Taking? Authorizing Provider  atorvastatin (LIPITOR) 10 MG tablet Take 10 mg by mouth daily. 03/28/21  Yes [provider]  cephALEXin (KEFLEX) 500 MG capsule Take 1 capsule (500 mg total) by mouth 4 (four) times daily for 7 days. 12/17/22 12/24/22 Yes Shirlee Latch, PA-C  fexofenadine (ALLEGRA) 180 MG tablet Take by mouth. 07/29/12  Yes [provider]  lisinopril (ZESTRIL) 5 MG tablet Take 5 mg by mouth daily. 04/05/21  Yes [provider]  mupirocin ointment (BACTROBAN) 2 % Apply 1 Application topically 2 (two) times daily. 12/17/22  Yes Shirlee Latch, PA-C  venlafaxine (EFFEXOR) 75 MG tablet Take 75 mg by mouth 2 (two) times daily.   Yes [provider]    Family History Family History  Problem Relation Age of Onset   Alzheimer's disease Mother    Heart attack Father     Social History Social History   Tobacco Use   Smoking status: Never   Smokeless tobacco: Never  Substance Use Topics   Alcohol use: Yes  Alcohol/week: 0.0 standard drinks of alcohol    Comment: socially   Drug use: No     Allergies   Patient has no known allergies.   Review of Systems Review of Systems  Musculoskeletal:  Positive for arthralgias. Negative for gait problem and joint swelling.  Skin:  Positive for wound. Negative for color change.  Neurological:  Negative for weakness and numbness.     Physical Exam Triage Vital Signs ED Triage Vitals [12/17/22 1157]  Enc Vitals Group     BP      Pulse      Resp      Temp      Temp src      SpO2      Weight      Height      Head Circumference      Peak Flow      Pain Score 0     Pain Loc      Pain Edu?      Excl. in GC?    No data found.  Updated Vital Signs BP (!) 160/75 (BP Location: Left  Arm)   Pulse 71   Temp 98.2 F (36.8 C) (Oral)   SpO2 94%      Physical Exam Vitals and nursing note reviewed.  Constitutional:      General: She is not in acute distress.    Appearance: Normal appearance. She is not ill-appearing or toxic-appearing.  HENT:     Head: Normocephalic and atraumatic.  Eyes:     General: No scleral icterus.       Right eye: No discharge.        Left eye: No discharge.     Conjunctiva/sclera: Conjunctivae normal.  Cardiovascular:     Rate and Rhythm: Normal rate and regular rhythm.     Heart sounds: Normal heart sounds.  Pulmonary:     Effort: Pulmonary effort is normal. No respiratory distress.     Breath sounds: Normal breath sounds.  Musculoskeletal:     Cervical back: Neck supple.     Comments: Left lower leg: There is an open wound of the left lower leg over the mid to distal tib/fib.  Surrounding erythema and swelling.  No drainage.  Area is slightly tender over wound.  See image below.  Skin:    General: Skin is dry.  Neurological:     General: No focal deficit present.     Mental Status: She is alert. Mental status is at baseline.     Motor: No weakness.     Gait: Gait normal.  Psychiatric:        Mood and Affect: Mood normal.        Behavior: Behavior normal.        Thought Content: Thought content normal.      UC Treatments / Results  Labs (all labs ordered are listed, but only abnormal results are displayed) Labs Reviewed - No data to display  EKG   Radiology DG Tibia/Fibula Left  Result Date: 12/17/2022 CLINICAL DATA:  Fall 1 week ago and hit leg on something. Area warm to the touch. EXAM: LEFT TIBIA AND FIBULA - 2 VIEW COMPARISON:  None Available. FINDINGS: There is no acute fracture or dislocation. Bony alignment is normal. There is no osseous erosion or destruction. The soft tissues are unremarkable. There is no soft tissue gas or radiopaque foreign body. IMPRESSION: Unremarkable tibia/fibula radiographs. Electronically  Signed   By: Lesia Hausen M.D.   On:  12/17/2022 12:20    Procedures Procedures (including critical care time)  Medications Ordered in UC Medications - No data to display  Initial Impression / Assessment and Plan / UC Course  I have reviewed the triage vital signs and the nursing notes.  Pertinent labs & imaging results that were available during my care of the patient were reviewed by me and considered in my medical decision making (see chart for details).   82 year old female presents for left lower leg swelling, pain and wound.  Injury occurred 7 to 10 days ago.  See image included in chart of patient's lower leg wound.  There is an open wound with slight surrounding erythema and swelling.  Area is diffusely tender.  X-ray of left tib-fib is negative.  Possible cellulitis complicating wound.  Starting patient on Keflex.  Also sent mupirocin ointment.  Advised to keep the wound clean with soap and water and then apply the ointment.  Reviewed RICE guidelines.  Placed referral to wound care for patient since it has taken a while for this to heal.   Final Clinical Impressions(s) / UC Diagnoses   Final diagnoses:  Open wound of left lower leg, initial encounter  Injury of left lower leg, initial encounter     Discharge Instructions      -The x-ray is normal.  No fractures. - I am concerned that your wound is poorly/will be healing.  It may be because you have a minor infection going on.  I have sent antibiotics to pharmacy.  Take as directed and complete full course.  Also sent an ointment for you to apply after you clean the area with soap and water every day. - You can take Tylenol for pain.  Elevate the lower extremity to help with swelling. - I placed a referral to wound care for you to follow-up.     ED Prescriptions     Medication Sig Dispense Auth. Provider   cephALEXin (KEFLEX) 500 MG capsule Take 1 capsule (500 mg total) by mouth 4 (four) times daily for 7 days. 28  capsule Eusebio Friendly B, PA-C   mupirocin ointment (BACTROBAN) 2 % Apply 1 Application topically 2 (two) times daily. 22 g Shirlee Latch, PA-C      PDMP not reviewed this encounter.   Shirlee Latch, PA-C 12/17/22 1249

## 2022-12-17 NOTE — ED Triage Notes (Signed)
Pt states she was doing some yard work slipped and fell x7-10 days ago  hit lower LT leg on something (not sure what), pt states rea warm to touch, denies any pain.

## 2022-12-17 NOTE — Discharge Instructions (Addendum)
-  The x-ray is normal.  No fractures. - I am concerned that your wound is poorly/will be healing.  It may be because you have a minor infection going on.  I have sent antibiotics to pharmacy.  Take as directed and complete full course.  Also sent an ointment for you to apply after you clean the area with soap and water every day. - You can take Tylenol for pain.  Elevate the lower extremity to help with swelling. - I placed a referral to wound care for you to follow-up.

## 2023-01-01 ENCOUNTER — Ambulatory Visit: Payer: Medicare HMO | Admitting: Physician Assistant

## 2023-01-07 ENCOUNTER — Ambulatory Visit: Payer: Medicare HMO | Admitting: Internal Medicine

## 2023-04-13 IMAGING — CR DG RIBS W/ CHEST 3+V*L*
5 series · 5 of 5 positions shown · non-contrast
Comparison: None.

CLINICAL DATA: 79 year old female fell yesterday. Left rib pain and
painful breathing.

EXAM:
LEFT RIBS AND CHEST - 3+ VIEW

[chest pa]
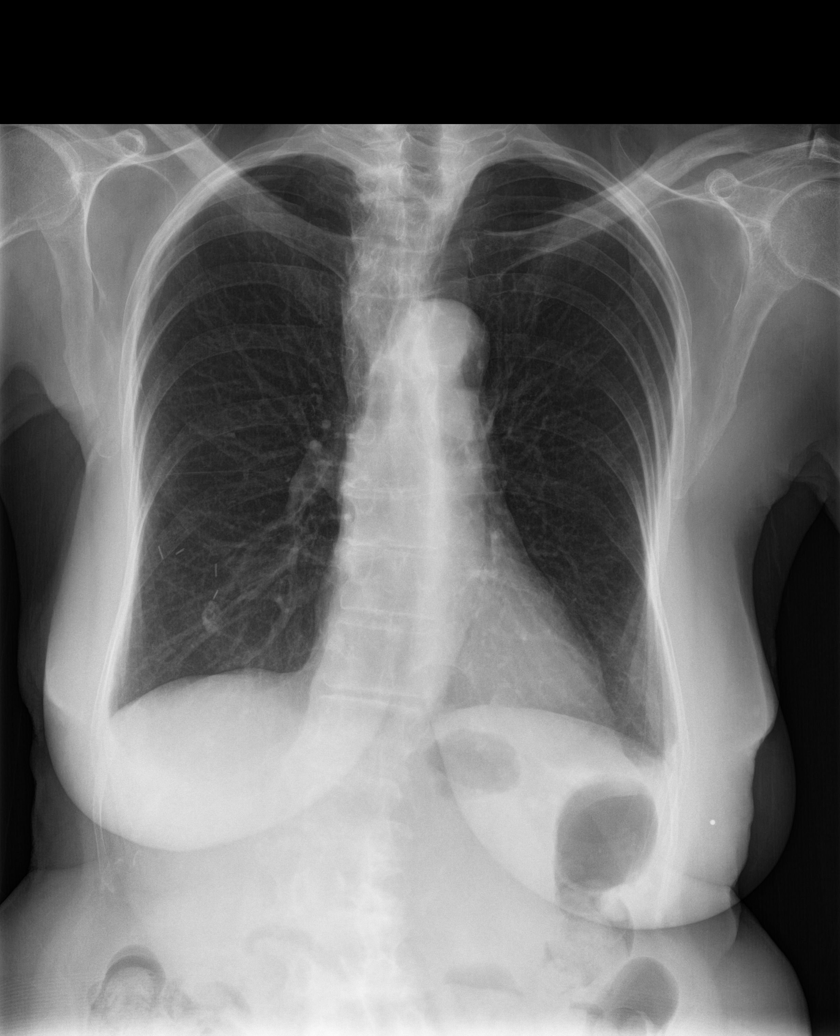

[rib pa (1 of 2)]
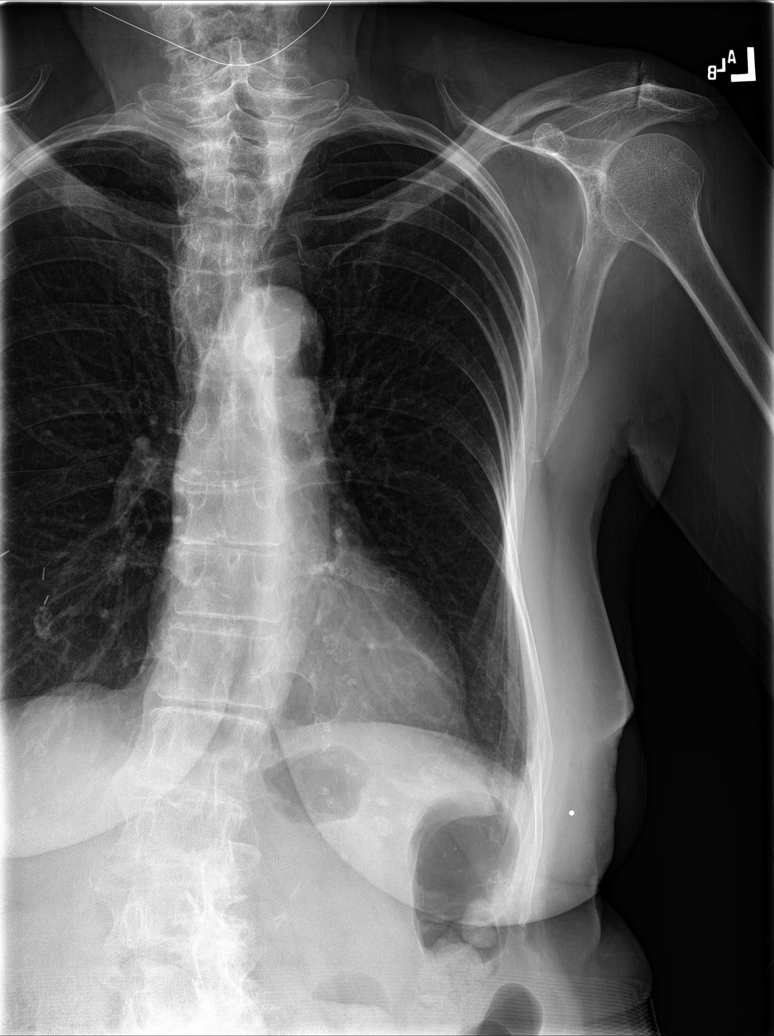

[rib pa (2 of 2)]
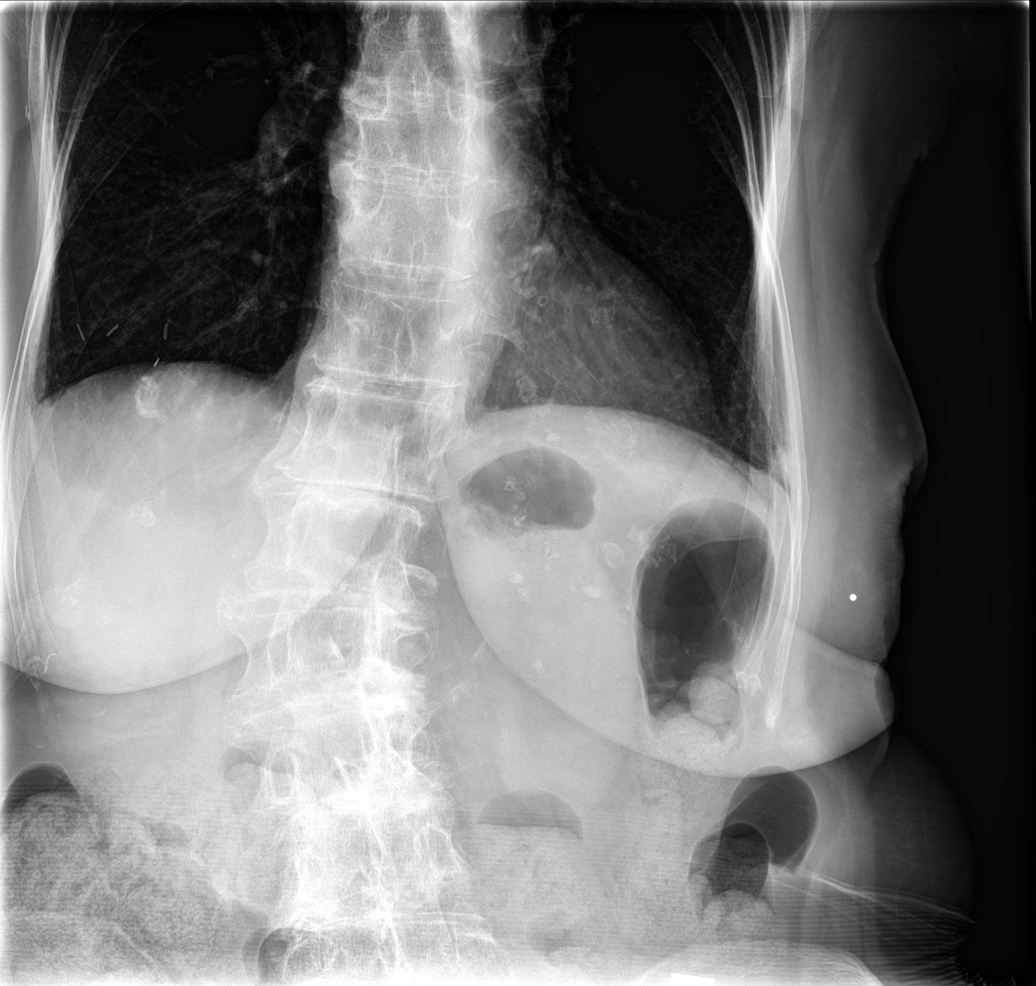

[rib obl (1 of 2)]
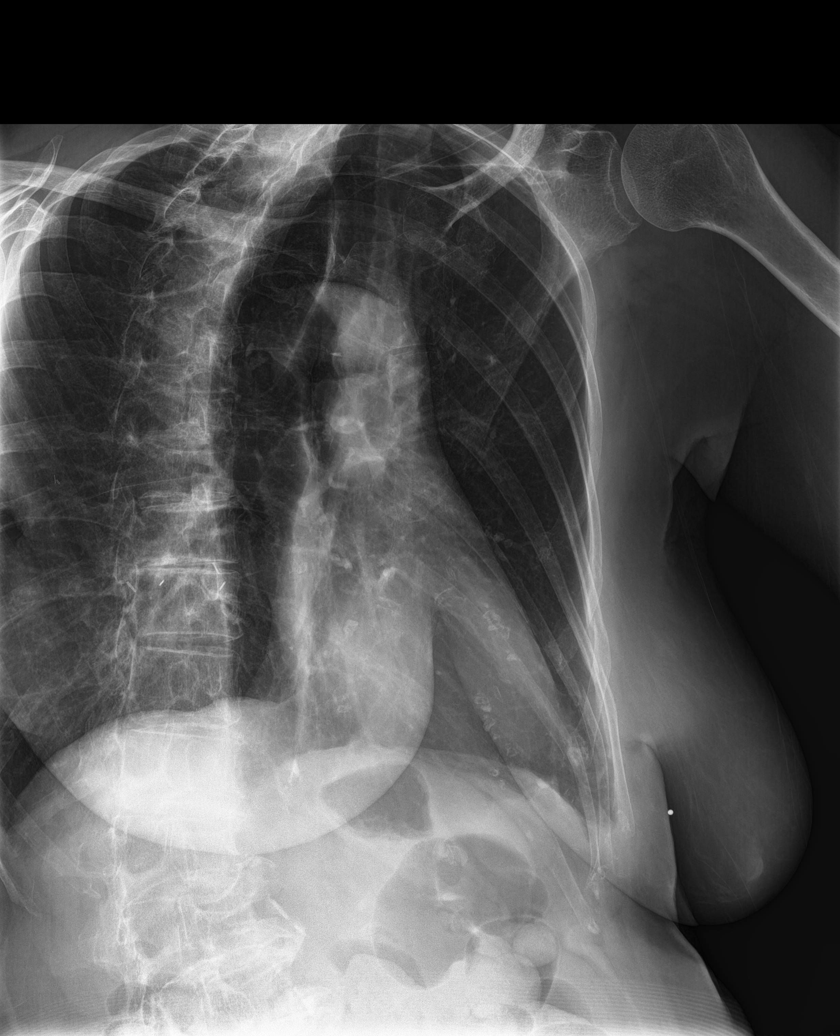

[rib obl (2 of 2)]
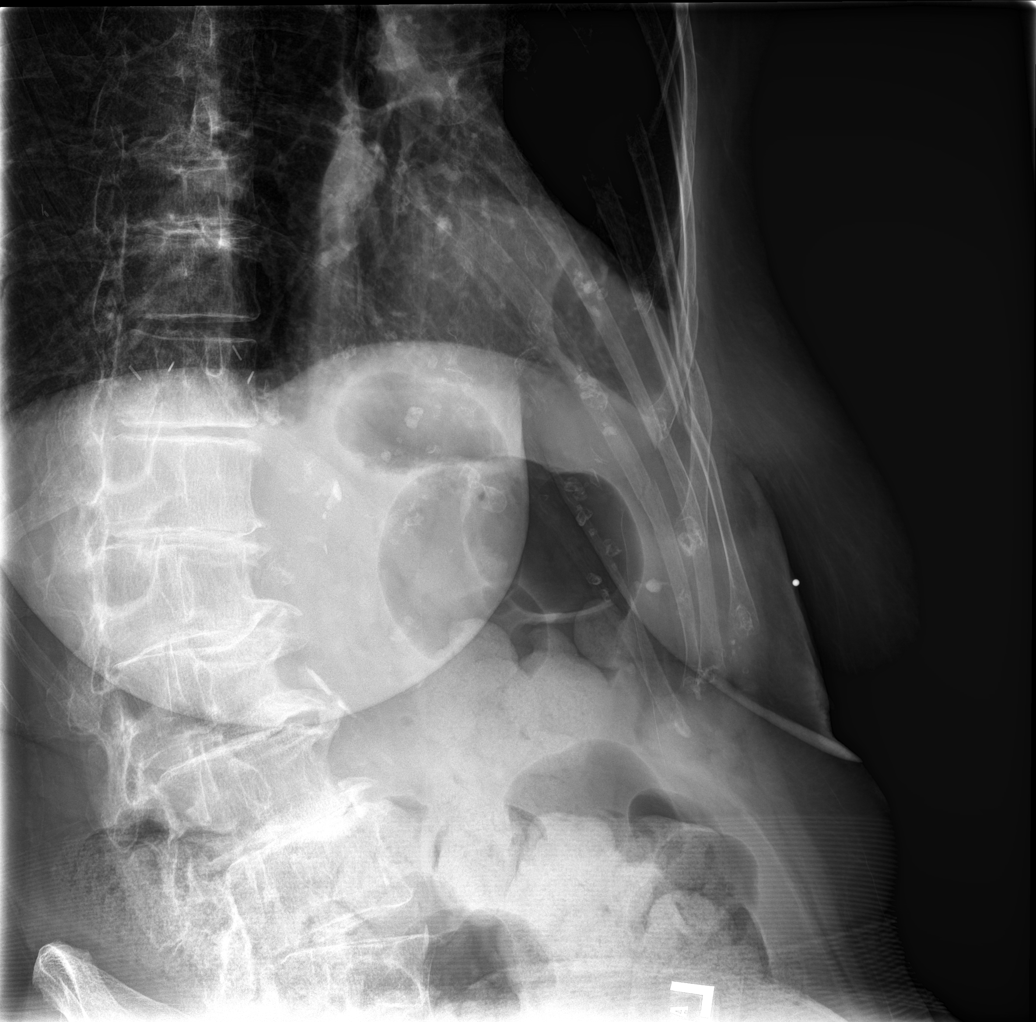

[5 of 5 positions shown; findings below may reference images not displayed]

FINDINGS: Large lung volumes. Normal cardiac size and mediastinal contours.
Visualized tracheal air column is within normal limits. No
pneumothorax or pleural effusion. Both lungs appear clear. There are
multiple small right chest surgical clips.

Negative visible bowel gas pattern.

Mild thoracolumbar scoliosis. Four oblique views of the left ribs.
There is a mildly displaced fracture of the left anterolateral 6th
rib. Minimally displaced fracture of the adjacent 5th rib. And
mildly displaced fractures also of the adjacent left 7th through
10th ribs. Anterior rib marker in that region. No pneumothorax or
pleural effusion identified on the oblique views. Other visible
osseous structures appear grossly intact.
IMPRESSION: 1. Mildly displaced fractures of the left ribs 5 through 10.
2. No acute cardiopulmonary abnormality.

## 2023-04-14 ENCOUNTER — Encounter: Payer: Self-pay | Admitting: Emergency Medicine

## 2023-04-14 ENCOUNTER — Ambulatory Visit
Admission: EM | Admit: 2023-04-14 | Discharge: 2023-04-14 | Disposition: A | Discharge status: Home / Self Care | Payer: Medicare Other

## 2023-04-14 NOTE — ED Triage Notes (Signed)
Pt had a mass cut out of her left arm on 04/09/23 at [facility]. She reports to the urgent care for suture removal and concerns about swelling around the area. Pt states the follow up plan was to return to the dermatologist but she did not want to there.

## 2023-04-14 NOTE — ED Provider Notes (Signed)
82 y/o female presents requesting suture removal from left arm.  She had a skin cancer mass removed by [facility] dermatology 5 days ago.  She says the stitches are ready to come out but does not know how long the dermatologist told her to keep them in.  She does not know if she has an appointment to follow-up with dermatology and that it would be more convenient to come to urgent care to have the sutures removed.  On exam she has a approximately 4 cm+ area of running sutures of the left upper arm.  There is mild swelling above and below the sutures.  I attempted to contact [facility] dermatology but it kept going to voicemail and patient did not have any discharge instructions from the dermatology office with her.  I am unable to verify which procedure patient had done and her specific instructions of when to have the sutures removed.  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  Patient seems frustrated.  Explained to her the importance of following up with a specialist especially when she has recently had a cancer removed.  Explained that it is imperative they ensure the cancer has been fully removed, there is no infection, wound is healing, etc.   Shirlee Latch, PA-C 04/14/23 1232
